# Patient Record
Sex: Female | Born: 1990
Health system: Southern US, Community
[De-identification: ages and names within clinical notes are randomized; demographics above are authoritative.]

## PROBLEM LIST (undated history)

## (undated) ENCOUNTER — Emergency Department: Disposition: A | Discharge status: Home / Self Care | Payer: 59

## (undated) DIAGNOSIS — Z8742 Personal history of other diseases of the female genital tract: Secondary | ICD-10-CM

## (undated) HISTORY — DX: Personal history of other diseases of the female genital tract: Z87.42

---

## 2016-01-11 ENCOUNTER — Other Ambulatory Visit (HOSPITAL_COMMUNITY)
Admission: RE | Admit: 2016-01-11 | Discharge: 2016-01-11 | Disposition: A | Discharge status: Home / Self Care | Payer: Self-pay | Source: Ambulatory Visit | Attending: Family Medicine | Admitting: Family Medicine

## 2016-01-11 ENCOUNTER — Emergency Department (INDEPENDENT_AMBULATORY_CARE_PROVIDER_SITE_OTHER)
Admission: EM | Admit: 2016-01-11 | Discharge: 2016-01-11 | Disposition: A | Discharge status: Home / Self Care | Payer: Self-pay | Source: Home / Self Care | Attending: Family Medicine | Admitting: Family Medicine

## 2016-01-11 ENCOUNTER — Encounter (HOSPITAL_COMMUNITY): Payer: Self-pay | Admitting: Emergency Medicine

## 2016-01-11 DIAGNOSIS — N760 Acute vaginitis: Secondary | ICD-10-CM | POA: Insufficient documentation

## 2016-01-11 DIAGNOSIS — Z113 Encounter for screening for infections with a predominantly sexual mode of transmission: Secondary | ICD-10-CM | POA: Insufficient documentation

## 2016-01-11 LAB — POCT URINALYSIS DIP (DEVICE)
Bilirubin Urine: NEGATIVE
GLUCOSE, UA: NEGATIVE mg/dL
KETONES UR: 15 mg/dL — AB
LEUKOCYTES UA: NEGATIVE
Nitrite: NEGATIVE
Protein, ur: NEGATIVE mg/dL
SPECIFIC GRAVITY, URINE: 1.025 (ref 1.005–1.030)
UROBILINOGEN UA: 1 mg/dL (ref 0.0–1.0)
pH: 7 (ref 5.0–8.0)

## 2016-01-11 MED ORDER — FLUCONAZOLE 150 MG PO TABS: 150.0000 mg | ORAL_TABLET | Freq: Once | ORAL | Status: AC

## 2016-01-11 NOTE — ED Provider Notes (Signed)
CSN: [id number]     Arrival date & time 01/11/16  1444 History   First MD Initiated Contact with Patient 01/11/16 1711     Chief Complaint  Patient presents with  . Vaginal Discharge   (Consider location/radiation/quality/duration/timing/severity/associated sxs/prior Treatment) Patient is a 25 y.o. female presenting with vaginal discharge. The history is provided by the patient. No language interpreter was used.  Vaginal Discharge Quality:  White, thin and milky Onset quality:  Gradual Timing:  Constant Progression:  Worsening Chronicity:  New Context: not after intercourse and not recent antibiotic use   Relieved by:  Nothing Worsened by:  Nothing tried Ineffective treatments:  None tried Associated symptoms: abdominal pain   Associated symptoms: no dyspareunia, no dysuria, no fever, no genital lesions, no nausea, no urinary frequency, no vaginal itching and no vomiting   Associated symptoms comment:  Mild left lower quadrant abdominal pain Risk factors: no gynecological surgery, no new sexual partner and no PID   Risk factors comment:  She had chlamydia 4 yrs ago. She uses condom regularly. She is sexually active   History reviewed. No pertinent past medical history. History reviewed. No pertinent past surgical history. No family history on file. Social History  Substance Use Topics  . Smoking status: Never Smoker   . Smokeless tobacco: None  . Alcohol Use: Yes     Comment: ocassionally   OB History    No data available     Review of Systems  Constitutional: Negative for fever.  Respiratory: Negative.   Cardiovascular: Negative.   Gastrointestinal: Positive for abdominal pain. Negative for nausea, vomiting, diarrhea and constipation.  Genitourinary: Positive for vaginal discharge. Negative for dysuria and dyspareunia.  All other systems reviewed and are negative.   Allergies  Review of patient's allergies indicates no known allergies.  Home Medications   Prior  to Admission medications   Not on File   Meds Ordered and Administered this Visit  Medications - No data to display  BP 106/64 mmHg  Pulse 82  Temp(Src) 98 F (36.7 C) (Oral)  Resp 18  SpO2 98%  LMP 12/09/2015 No data found.   Physical Exam  Constitutional: She appears well-developed. No distress.  Cardiovascular: Normal rate, regular rhythm and normal heart sounds.   No murmur heard. Pulmonary/Chest: Effort normal and breath sounds normal. No respiratory distress. She has no wheezes.  Abdominal: Soft. Bowel sounds are normal. She exhibits no distension and no mass. There is no tenderness.  Genitourinary: Cervix exhibits discharge. Cervix exhibits no friability.    Nursing note and vitals reviewed.   ED Course  Procedures (including critical care time)  Labs Review Labs Reviewed  POCT URINALYSIS DIP (DEVICE) - Abnormal; Notable for the following:    Ketones, ur 15 (*)    Hgb urine dipstick TRACE (*)    All other components within normal limits    Imaging Review No results found.   Visual Acuity Review  Right Eye Distance:   Left Eye Distance:   Bilateral Distance:    Right Eye Near:   Left Eye Near:    Bilateral Near:         MDM  No diagnosis found. Vaginitis  Pelvic exam completed and vaginal specimen obtain for wet prep and GC/Chlamydia. One dose of diflucan given due to the appearance of her discharge. Patient otherwise stable for discharge home. I will contact her with test result and treat if necessary.    Krista ElandKehinde T Anquan Azzarello, MD 01/11/16 1728

## 2016-01-11 NOTE — Discharge Instructions (Signed)
Vaginitis Vaginitis is an inflammation of the vagina. It can happen when the normal bacteria and yeast in the vagina grow too much. There are different types. Treatment will depend on the type you have. HOME CARE  Take all medicines as told by your doctor.  Keep your vagina area clean and dry. Avoid soap. Rinse the area with water.  Avoid washing and cleaning out the vagina (douching).  Do not use tampons or have sex (intercourse) until your treatment is done.  Wipe from front to back after going to the restroom.  Wear cotton underwear.  Avoid wearing underwear while you sleep until your vaginitis is gone.  Avoid tight pants. Avoid underwear or nylons without a cotton panel.  Take off wet clothing (such as a bathing suit) as soon as you can.  Use mild, unscented products. Avoid fabric softeners and scented:  Feminine sprays.  Laundry detergents.  Tampons.  Soaps or bubble baths.  Practice safe sex and use condoms. GET HELP RIGHT AWAY IF:   You have belly (abdominal) pain.  You have a fever or lasting symptoms for more than 2-3 days.  You have a fever and your symptoms suddenly get worse. MAKE SURE YOU:   Understand these instructions.  Will watch this condition.  Will get help right away if you are not doing well or get worse.   This information is not intended to replace advice given to you by your health care provider. Make sure you discuss any questions you have with your health care provider.   Document Released: 03/12/2009 Document Revised: 09/07/2012 Document Reviewed: 05/26/2012 Elsevier Interactive Patient Education 2016 Elsevier Inc.  

## 2016-01-11 NOTE — ED Notes (Signed)
Here with c/o 3 days white increased vaginal d/c, slight odor and left lower quad discomfort Denies fever,chills,n,v LMP- 12/12, Nexplanon

## 2016-01-13 LAB — CERVICOVAGINAL ANCILLARY ONLY
CHLAMYDIA, DNA PROBE: NEGATIVE
NEISSERIA GONORRHEA: NEGATIVE

## 2016-01-14 ENCOUNTER — Telehealth (HOSPITAL_COMMUNITY): Payer: Self-pay | Admitting: Emergency Medicine

## 2016-01-14 LAB — CERVICOVAGINAL ANCILLARY ONLY: Wet Prep (BD Affirm): POSITIVE — AB

## 2016-01-14 MED ORDER — METRONIDAZOLE 500 MG PO TABS: 500.0000 mg | ORAL_TABLET | Freq: Two times a day (BID) | ORAL | Status: DC

## 2016-01-14 NOTE — ED Provider Notes (Signed)
CSN: [id number]     Arrival date & time 01/11/16  1444 History   First MD Initiated Contact with Patient 01/11/16 1711     Chief Complaint  Patient presents with  . Vaginal Discharge   (Consider location/radiation/quality/duration/timing/severity/associated sxs/prior Treatment) HPI  History reviewed. No pertinent past medical history. History reviewed. No pertinent past surgical history. No family history on file. Social History  Substance Use Topics  . Smoking status: Never Smoker   . Smokeless tobacco: None  . Alcohol Use: Yes     Comment: ocassionally   OB History    No data available     Review of Systems  Allergies  Review of patient's allergies indicates no known allergies.  Home Medications   Prior to Admission medications   Medication Sig Start Date End Date Taking? Authorizing Provider  fluconazole (DIFLUCAN) 150 MG tablet Take 1 tablet (150 mg total) by mouth once. 01/11/16 01/18/16  Doreene Eland, MD  metroNIDAZOLE (FLAGYL) 500 MG tablet Take 1 tablet (500 mg total) by mouth 2 (two) times daily. X 7 days 01/14/16   Hayden Rasmussen, NP   Meds Ordered and Administered this Visit  Medications - No data to display  BP 106/64 mmHg  Pulse 82  Temp(Src) 98 F (36.7 C) (Oral)  Resp 18  SpO2 98%  LMP 12/09/2015 No data found.   Physical Exam  ED Course  Procedures (including critical care time)  Labs Review Labs Reviewed  POCT URINALYSIS DIP (DEVICE) - Abnormal; Notable for the following:    Ketones, ur 15 (*)    Hgb urine dipstick TRACE (*)    All other components within normal limits  CERVICOVAGINAL ANCILLARY ONLY - Abnormal; Notable for the following:    Wet Prep (BD Affirm) **POSITIVE for Gardnerella** (*)    All other components within normal limits  CERVICOVAGINAL ANCILLARY ONLY    Imaging Review No results found.   Visual Acuity Review  Right Eye Distance:   Left Eye Distance:   Bilateral Distance:    Right Eye Near:   Left Eye Near:     Bilateral Near:         MDM   1. Vaginitis      01/13/2015 Cervical cytology reveals gardnerella. Tx with flagyl 500 bid x 7 d. E- scribed to listed pharmacy by D. Macenzie Burford, NP     Hayden Rasmussen, NP 01/14/16 1423

## 2016-01-14 NOTE — ED Notes (Signed)
Called pt in re: to recent labs for visit on 01/11/16... Pt ID'd properly Notified her of results... Neg for Gon/Chlam ... Pos for Gardnerella Pt reports she's feeling better w/sx subsiding.  Adv pt that we need to e-Rx Flagy to her pharmacy.... Pt uses [facility])... Updated pharmacy Spoke w/David M, NP... e-Rx Flagyl 500 mg BID x7 days Adv pt to return if sx are not getting any better.  Pt given education on safe sex and proper hygiene... Pt verb understanding

## 2016-07-12 ENCOUNTER — Encounter (HOSPITAL_COMMUNITY): Payer: Self-pay | Admitting: Emergency Medicine

## 2016-07-12 ENCOUNTER — Ambulatory Visit (HOSPITAL_COMMUNITY)
Admission: EM | Admit: 2016-07-12 | Discharge: 2016-07-12 | Disposition: A | Discharge status: Home / Self Care | Payer: Self-pay | Attending: Family Medicine | Admitting: Family Medicine

## 2016-07-12 DIAGNOSIS — N760 Acute vaginitis: Secondary | ICD-10-CM

## 2016-07-12 DIAGNOSIS — B3731 Acute candidiasis of vulva and vagina: Secondary | ICD-10-CM

## 2016-07-12 DIAGNOSIS — Z880 Allergy status to penicillin: Secondary | ICD-10-CM | POA: Insufficient documentation

## 2016-07-12 DIAGNOSIS — B373 Candidiasis of vulva and vagina: Secondary | ICD-10-CM

## 2016-07-12 DIAGNOSIS — Z79899 Other long term (current) drug therapy: Secondary | ICD-10-CM | POA: Insufficient documentation

## 2016-07-12 LAB — POCT URINALYSIS DIP (DEVICE)
GLUCOSE, UA: NEGATIVE mg/dL
Ketones, ur: NEGATIVE mg/dL
LEUKOCYTES UA: NEGATIVE
Nitrite: NEGATIVE
PROTEIN: 30 mg/dL — AB
SPECIFIC GRAVITY, URINE: 1.025 (ref 1.005–1.030)
UROBILINOGEN UA: 1 mg/dL (ref 0.0–1.0)
pH: 6.5 (ref 5.0–8.0)

## 2016-07-12 LAB — POCT PREGNANCY, URINE: PREG TEST UR: NEGATIVE

## 2016-07-12 MED ORDER — FLUCONAZOLE 150 MG PO TABS: 150.0000 mg | ORAL_TABLET | Freq: Once | ORAL | Status: AC

## 2016-07-12 NOTE — Discharge Instructions (Signed)
You have an unusually large amount of thick discharge in your vaginal vault. This is likely yeast infection. Take one dose of diflucan today and repeat the other in 3 days. We will contact you with other test result.   Monilial Vaginitis Vaginitis in a soreness, swelling and redness (inflammation) of the vagina and vulva. Monilial vaginitis is not a sexually transmitted infection. CAUSES  Yeast vaginitis is caused by yeast (candida) that is normally found in your vagina. With a yeast infection, the candida has overgrown in number to a point that upsets the chemical balance. SYMPTOMS   White, thick vaginal discharge.  Swelling, itching, redness and irritation of the vagina and possibly the lips of the vagina (vulva).  Burning or painful urination.  Painful intercourse. DIAGNOSIS  Things that may contribute to monilial vaginitis are:  Postmenopausal and virginal states.  Pregnancy.  Infections.  Being tired, sick or stressed, especially if you had monilial vaginitis in the past.  Diabetes. Good control will help lower the chance.  Birth control pills.  Tight fitting garments.  Using bubble bath, feminine sprays, douches or deodorant tampons.  Taking certain medications that kill germs (antibiotics).  Sporadic recurrence can occur if you become ill. TREATMENT  Your caregiver will give you medication.  There are several kinds of anti monilial vaginal creams and suppositories specific for monilial vaginitis. For recurrent yeast infections, use a suppository or cream in the vagina 2 times a week, or as directed.  Anti-monilial or steroid cream for the itching or irritation of the vulva may also be used. Get your caregiver's permission.  Painting the vagina with methylene blue solution may help if the monilial cream does not work.  Eating yogurt may help prevent monilial vaginitis. HOME CARE INSTRUCTIONS   Finish all medication as prescribed.  Do not have sex until  treatment is completed or after your caregiver tells you it is okay.  Take warm sitz baths.  Do not douche.  Do not use tampons, especially scented ones.  Wear cotton underwear.  Avoid tight pants and panty hose.  Tell your sexual partner that you have a yeast infection. They should go to their caregiver if they have symptoms such as mild rash or itching.  Your sexual partner should be treated as well if your infection is difficult to eliminate.  Practice safer sex. Use condoms.  Some vaginal medications cause latex condoms to fail. Vaginal medications that harm condoms are:  Cleocin cream.  Butoconazole (Femstat).  Terconazole (Terazol) vaginal suppository.  Miconazole (Monistat) (may be purchased over the counter). SEEK MEDICAL CARE IF:   You have a temperature by mouth above 102 F (38.9 C).  The infection is getting worse after 2 days of treatment.  The infection is not getting better after 3 days of treatment.  You develop blisters in or around your vagina.  You develop vaginal bleeding, and it is not your menstrual period.  You have pain when you urinate.  You develop intestinal problems.  You have pain with sexual intercourse.   This information is not intended to replace advice given to you by your health care provider. Make sure you discuss any questions you have with your health care provider.   Document Released: 09/23/2005 Document Revised: 03/07/2012 Document Reviewed: 06/17/2015 Elsevier Interactive Patient Education [employer]2016 Elsevier Inc.

## 2016-07-12 NOTE — ED Notes (Signed)
[facility] with a complaint of a vaginal discharge x 1 week. The patient stated that she did use Monistat1 OTC with no results.

## 2016-07-12 NOTE — ED Provider Notes (Signed)
CSN: [id number]     Arrival date & time 07/12/16  1437 History   First MD Initiated Contact with Patient 07/12/16 1645     Chief Complaint  Patient presents with  . Vaginal Discharge   (Consider location/radiation/quality/duration/timing/severity/associated sxs/prior Treatment) Patient is a 25 y.o. female presenting with vaginal discharge. The history is provided by the patient. No language interpreter was used.  Vaginal Discharge Quality:  White and thick Onset quality:  Gradual Duration:  1 week Timing:  Constant Progression:  Unchanged Context: spontaneously   Context: not after intercourse and not during urination   Relieved by:  Nothing Worsened by:  Nothing tried Ineffective treatments: OTC monistat. Associated symptoms: no abdominal pain, no dysuria, no genital lesions, no nausea, no rash, no vaginal itching and no vomiting   Associated symptoms comment:  Last sexual intercourse was 2 weeks ago. She used protection. LMP was 2 wks ago. She uses Nexplanon on Risk factors: no immunosuppression, no STI exposure and no unprotected sex     History reviewed. No pertinent past medical history. History reviewed. No pertinent past surgical history. History reviewed. No pertinent family history. Social History  Substance Use Topics  . Smoking status: Never Smoker   . Smokeless tobacco: None  . Alcohol Use: Yes     Comment: ocassionally   OB History    No data available     Review of Systems  Respiratory: Negative.   Cardiovascular: Negative.   Gastrointestinal: Negative for nausea, vomiting and abdominal pain.  Genitourinary: Positive for vaginal discharge. Negative for dysuria and frequency.  All other systems reviewed and are negative.   Allergies  Penicillins  Home Medications   Prior to Admission medications   Medication Sig Start Date End Date Taking? Authorizing Provider  metroNIDAZOLE (FLAGYL) 500 MG tablet Take 1 tablet (500 mg total) by mouth 2 (two) times  daily. X 7 days 01/14/16   Hayden Rasmussen, NP   Meds Ordered and Administered this Visit  Medications - No data to display  BP 104/67 mmHg  Pulse 86  Temp(Src) 99.2 F (37.3 C) (Oral)  Resp 16  SpO2 97%  LMP 06/28/2016 (Exact Date) No data found.   Physical Exam  Constitutional: She appears well-developed. No distress.  Cardiovascular: Normal rate, regular rhythm and normal heart sounds.   No murmur heard. Pulmonary/Chest: Effort normal and breath sounds normal. No respiratory distress. She has no wheezes.  Abdominal: She exhibits no distension.  Genitourinary: There is no rash or tenderness on the right labia. There is no rash or tenderness on the left labia. Cervix exhibits discharge. Vaginal discharge found.  Large amount of thick white cheese like discharge  Nursing note and vitals reviewed.   ED Course  Procedures (including critical care time)  Labs Review Labs Reviewed  POCT URINALYSIS DIP (DEVICE) - Abnormal; Notable for the following:    Bilirubin Urine SMALL (*)    Hgb urine dipstick TRACE (*)    Protein, ur 30 (*)    All other components within normal limits  POCT PREGNANCY, URINE    Imaging Review No results found.   Visual Acuity Review  Right Eye Distance:   Left Eye Distance:   Bilateral Distance:    Right Eye Near:   Left Eye Near:    Bilateral Near:      Urinalysis    Component Value Date/Time   LABSPEC 1.025 07/12/2016 1635   PHURINE 6.5 07/12/2016 1635   GLUCOSEU NEGATIVE 07/12/2016 1635   HGBUR TRACE* 07/12/2016  1635   BILIRUBINUR SMALL* 07/12/2016 1635   KETONESUR NEGATIVE 07/12/2016 1635   PROTEINUR 30* 07/12/2016 1635   UROBILINOGEN 1.0 07/12/2016 1635   NITRITE NEGATIVE 07/12/2016 1635   LEUKOCYTESUR NEGATIVE 07/12/2016 1635       MDM  No diagnosis found. Vaginitis  Candida vaginitis   usually large amount of discharge suggestive of candida infection. Diflucan prescribed, to repeat second dose in 3 days given the  quantity of her discharge. GC/Chlamydia tested as well as HIV and RPR. I will contact her with result. Urine pregnancy neg.    Doreene ElandKehinde T Eniola, MD 07/12/16 [phone]

## 2016-07-13 LAB — HIV ANTIBODY (ROUTINE TESTING W REFLEX): HIV Screen 4th Generation wRfx: NONREACTIVE

## 2016-07-13 LAB — CERVICOVAGINAL ANCILLARY ONLY
Chlamydia: NEGATIVE
Neisseria Gonorrhea: NEGATIVE

## 2016-07-13 LAB — RPR: RPR Ser Ql: NONREACTIVE

## 2016-07-14 LAB — CERVICOVAGINAL ANCILLARY ONLY: Wet Prep (BD Affirm): POSITIVE — AB

## 2016-07-15 ENCOUNTER — Telehealth: Payer: Self-pay | Admitting: Family Medicine

## 2016-07-15 ENCOUNTER — Telehealth (HOSPITAL_COMMUNITY): Payer: Self-pay | Admitting: *Deleted

## 2016-07-15 MED ORDER — METRONIDAZOLE 500 MG PO TABS: 500.0000 mg | ORAL_TABLET | Freq: Two times a day (BID) | ORAL | Status: AC

## 2016-07-15 NOTE — Telephone Encounter (Signed)
Positive BV discussed with patient. Metronidazole e-scribed. All questions were answered.

## 2016-11-24 ENCOUNTER — Encounter (HOSPITAL_COMMUNITY): Payer: Self-pay | Admitting: Emergency Medicine

## 2016-11-24 ENCOUNTER — Ambulatory Visit (HOSPITAL_COMMUNITY)
Admission: EM | Admit: 2016-11-24 | Discharge: 2016-11-24 | Disposition: A | Discharge status: Home / Self Care | Payer: 59 | Attending: Emergency Medicine | Admitting: Emergency Medicine

## 2016-11-24 DIAGNOSIS — J039 Acute tonsillitis, unspecified: Secondary | ICD-10-CM

## 2016-11-24 DIAGNOSIS — J029 Acute pharyngitis, unspecified: Secondary | ICD-10-CM | POA: Diagnosis present

## 2016-11-24 DIAGNOSIS — L040 Acute lymphadenitis of face, head and neck: Secondary | ICD-10-CM | POA: Diagnosis not present

## 2016-11-24 DIAGNOSIS — Z880 Allergy status to penicillin: Secondary | ICD-10-CM | POA: Insufficient documentation

## 2016-11-24 LAB — POCT RAPID STREP A: Streptococcus, Group A Screen (Direct): NEGATIVE

## 2016-11-24 MED ORDER — ACETAMINOPHEN 325 MG PO TABS
ORAL_TABLET | ORAL | Status: AC
  Filled 2016-11-24: qty 2

## 2016-11-24 MED ORDER — ACETAMINOPHEN 325 MG PO TABS
650.0000 mg | ORAL_TABLET | Freq: Once | ORAL | Status: AC
  Administered 2016-11-24: 650 mg via ORAL

## 2016-11-24 MED ORDER — AZITHROMYCIN 250 MG PO TABS: ORAL_TABLET | ORAL | 0 refills | Status: DC

## 2016-11-24 NOTE — ED Triage Notes (Signed)
Sore throat, very painful with swallowing.  Feels very tired and low energy.  Denies fever.  Onset of symptoms Saturday night.  Patient has had headaches

## 2016-11-24 NOTE — Discharge Instructions (Signed)
Drink lots of cool liquids. May use Cepacol lozenges to help numb the throat. Ibuprofen 600 mg every 6 hours as needed for throat pain and discomfort. May also take Tylenol every 4 hours.

## 2016-11-24 NOTE — ED Provider Notes (Signed)
CSN: [id number]     Arrival date & time 11/24/16  1837 History   First MD Initiated Contact with Patient 11/24/16 2023     Chief Complaint  Patient presents with  . Sore Throat   (Consider location/radiation/quality/duration/timing/severity/associated sxs/prior Treatment) 25 year old female complaining of sore throat 3 days. States she has had undocumented fever at home and sweating. Denies earache or GI symptoms.      History reviewed. No pertinent past medical history. History reviewed. No pertinent surgical history. History reviewed. No pertinent family history. Social History  Substance Use Topics  . Smoking status: Never Smoker  . Smokeless tobacco: Not on file  . Alcohol use Yes     Comment: ocassionally   OB History    No data available     Review of Systems  Constitutional: Positive for activity change, chills, fatigue and fever.  HENT: Positive for rhinorrhea and sore throat.   Respiratory: Negative.   Gastrointestinal: Negative.   Genitourinary: Negative.   Neurological: Negative.   All other systems reviewed and are negative.   Allergies  Penicillins  Home Medications   Prior to Admission medications   Medication Sig Start Date End Date Taking? Authorizing Provider  azithromycin (ZITHROMAX) 250 MG tablet 2 tabs po daily for 5 days 11/24/16   Hayden Rasmussenavid Aune Adami, NP   Meds Ordered and Administered this Visit   Medications  acetaminophen (TYLENOL) tablet 650 mg (650 mg Oral Given 11/24/16 2003)    BP 111/64 (BP Location: Left Arm)   Pulse 103   Temp 101.3 F (38.5 C) (Oral)   Resp 18   LMP 11/24/2016   SpO2 97%  No data found.   Physical Exam  Constitutional: She is oriented to person, place, and time. She appears well-developed and well-nourished. No distress.  HENT:  Head: Normocephalic and atraumatic.  Mouth/Throat: Oropharyngeal exudate present.  Left TM obscured by large amount of earwax. Right TM partially observed and transparent without  apparent erythema. Oropharynx with erythema, mildly enlarged palatine tonsils with exudates.  Eyes: EOM are normal.  Neck: Normal range of motion. Neck supple.  Cardiovascular: Normal rate and regular rhythm.   Pulmonary/Chest: Effort normal.  Lymphadenopathy:    She has cervical adenopathy.  Neurological: She is alert and oriented to person, place, and time.  Skin: Skin is warm and dry. No rash noted.  Nursing note and vitals reviewed.   Urgent Care Course   Clinical Course     Procedures (including critical care time)  Labs Review Labs Reviewed  POCT RAPID STREP A   Results for orders placed or performed during the hospital encounter of 11/24/16  POCT rapid strep A [facility]  Result Value Ref Range   Streptococcus, Group A Screen (Direct) NEGATIVE NEGATIVE     Imaging Review No results found.   Visual Acuity Review  Right Eye Distance:   Left Eye Distance:   Bilateral Distance:    Right Eye Near:   Left Eye Near:    Bilateral Near:         MDM   1. Exudative tonsillitis   2. Acute cervical adenitis    Drink lots of cool liquids. May use Cepacol lozenges to help numb the throat. Ibuprofen 600 mg every 6 hours as needed for throat pain and discomfort. May also take Tylenol every 4 hours. Meds ordered this encounter  Medications  . acetaminophen (TYLENOL) tablet 650 mg  . azithromycin (ZITHROMAX) 250 MG tablet    Sig: 2 tabs po daily  for 5 days    Dispense:  10 tablet    Refill:  0    Order Specific Question:   Supervising Provider    Answer:   Micheline ChapmanHONIG, ERIN J [[id number]]       Hayden Rasmussenavid Christabel Camire, NP 11/24/16 2033

## 2016-11-27 LAB — CULTURE, GROUP A STREP (THRC)

## 2016-12-01 ENCOUNTER — Telehealth (HOSPITAL_COMMUNITY): Payer: Self-pay | Admitting: Emergency Medicine

## 2016-12-01 NOTE — Telephone Encounter (Signed)
Called pt and notified of recent lab results  Pt ID'd properly... Reports feeling better and sx have subsided Pt is taking/tolarating well meds given at visit.  Adv pt if sx are not getting better to return or to f/u w/PCP Pt verb understanding.

## 2016-12-01 NOTE — Telephone Encounter (Signed)
-----   Message from Charm RingsErin J Honig, MD sent at 11/27/2016  5:51 PM EST ----- Please notify patient of negative throat culture. Okay [facility]. EH

## 2017-04-04 ENCOUNTER — Ambulatory Visit (HOSPITAL_COMMUNITY)
Admission: EM | Admit: 2017-04-04 | Discharge: 2017-04-04 | Disposition: A | Discharge status: Home / Self Care | Payer: 59 | Attending: Emergency Medicine | Admitting: Emergency Medicine

## 2017-04-04 ENCOUNTER — Encounter (HOSPITAL_COMMUNITY): Payer: Self-pay | Admitting: Emergency Medicine

## 2017-04-04 DIAGNOSIS — B9689 Other specified bacterial agents as the cause of diseases classified elsewhere: Secondary | ICD-10-CM | POA: Insufficient documentation

## 2017-04-04 DIAGNOSIS — Z880 Allergy status to penicillin: Secondary | ICD-10-CM | POA: Insufficient documentation

## 2017-04-04 DIAGNOSIS — N760 Acute vaginitis: Secondary | ICD-10-CM

## 2017-04-04 DIAGNOSIS — N898 Other specified noninflammatory disorders of vagina: Secondary | ICD-10-CM | POA: Diagnosis present

## 2017-04-04 MED ORDER — METRONIDAZOLE 500 MG PO TABS: 500.0000 mg | ORAL_TABLET | Freq: Two times a day (BID) | ORAL | 0 refills | Status: AC

## 2017-04-04 NOTE — ED Provider Notes (Signed)
HPI  SUBJECTIVE:  Krista Pearson is a 26 y.o. female who presents with increased vaginal discharge for the past 10 days. No dysuria, urgency, frequency, cloudy or oderous urine, hematuria,  genital blisters, vaginal itching. No aggravating, alleviating factors. Has not tried anything for this. No fevers, N/V, abd pain, leg pain, back pain. No recent abx use, perfumed soaps / bodywashes. No dysparunia. Pt is in a longterm monogamous sexual relationship with a female partner who is  asxatic. Does use  condoms consistently. STD's not a concern today. Similar sx before when had BV. No history of gonorrhea chlamydia, Trichomonas, yeast infection. No h/o syphilis, herpes, HIV. No h/o PID, ectopic pregnancy. No h/o DM, HTN. LMP: 3 weeks ago. Denies possibility of being pregnant states he do not need to check. PMD: None.    History reviewed. No pertinent past medical history.  History reviewed. No pertinent surgical history.  History reviewed. No pertinent family history.  Social History  Substance Use Topics  . Smoking status: Never Smoker  . Smokeless tobacco: Never Used  . Alcohol use Yes     Comment: ocassionally    No current facility-administered medications for this encounter.   Current Outpatient Prescriptions:  .  metroNIDAZOLE (FLAGYL) 500 MG tablet, Take 1 tablet (500 mg total) by mouth 2 (two) times daily., Disp: 14 tablet, Rfl: 0  Allergies  Allergen Reactions  . Penicillins      ROS  As noted in HPI.   Physical Exam  BP 94/61 (BP Location: Right Arm)   Pulse 96   Temp 97.8 F (36.6 C) (Oral)   Resp 16   SpO2 100%   Constitutional: Well developed, well nourished, no acute distress Eyes:  EOMI, conjunctiva normal bilaterally HENT: Normocephalic, atraumatic,mucus membranes moist Respiratory: Normal inspiratory effort Cardiovascular: Normal rate GI:  No suprapubic tenderness  GU: External genitalia normal.  Normal vaginal mucosa.  Normal os. Thin  nonoderous  white  vaginal discharge.  Uterus smooth, NT. No  CMT. No adnexal tenderness. No adnexal masses.  Chaperone present during exam skin: No rash, skin intact Musculoskeletal: no deformities Neurologic: Alert & oriented x 3, no focal neuro deficits Psychiatric: Speech and behavior appropriate   ED Course   Medications - No data to display  Orders Placed This Encounter  Procedures  . Pelvic exam    Standing Status:   Standing    Number of Occurrences:   1    No results found for this or any previous visit (from the past 24 hour(s)). No results found.  ED Clinical Impression  BV (bacterial vaginosis)   ED Assessment/Plan  Previous records reviewed. She has had BV twice in 2017. no history of gonorrhea Chlamydia HIV, syphilis, yeast  H&P most c/w BV. Sent off GC/chlamydia, wet prep. Pt declined HIV, RPR. Will not treat empirically now.  Will send home with flagyl. Advised pt to refrain from sexual contact until she he knows lab results, symptoms resolve, and partner(s) are treated if necessary. Pt provided working phone number. Follow-up with PMD of choice as needed. Providing primary care referral list. Discussed labs, MDM, plan and followup with patient. Pt agrees with plan.   Meds ordered this encounter  Medications  . metroNIDAZOLE (FLAGYL) 500 MG tablet    Sig: Take 1 tablet (500 mg total) by mouth 2 (two) times daily.    Dispense:  14 tablet    Refill:  0    *This clinic note was created using Scientist, clinical (histocompatibility and immunogenetics). Therefore, there may  be occasional mistakes despite careful proofreading.  ?    Domenick Gong, MD 04/04/17 [id number]

## 2017-04-04 NOTE — ED Triage Notes (Signed)
Patient presents today to [facility] with a complaint of vaginal discharge she states that she has noticed a white colored discharge. Patient denies, itching and odor.

## 2017-04-04 NOTE — Discharge Instructions (Signed)
Take the medication as written. Give us a working phone number so that we can contact you if needed. Refrain from sexual contact until you know your results and your partner(s) are treated if necessary. Return if you get worse, have a fever >100.4, or for any concerns.  ° °Go to www.goodrx.com to look up your medications. This will give you a list of where you can find your prescriptions at the most affordable prices.   ° °Below is a list of primary care practices who are taking new patients for you to follow-up with. °[facility] °[address] °[place], [place] [address] °[phone] ° °[facility] °[phone] °[address] °[place] [address] ° °[facility]: [address] °[place] [address]  °[phone] ° °[facility]: [address] °[place] [address]   °[phone] ° °[facility]: [address] °[place] [address]  °[phone] ° °[facility] : [address] [place] [address] °[phone] ° °[facility]: [address] °[place] [address] °[phone] ° °[facility]: [address] °[place] [address] °[phone] ° °Dr. Mahima Pandey [address] [facility] [place]  [address]  °[phone] ° °Dr. George Osei-Bonsu, [facility]. [address] [place], [place] [address]  °[phone] ° °  °

## 2017-04-05 ENCOUNTER — Telehealth (HOSPITAL_COMMUNITY): Payer: Self-pay | Admitting: Internal Medicine

## 2017-04-05 LAB — CERVICOVAGINAL ANCILLARY ONLY
Bacterial vaginitis: NEGATIVE
CANDIDA VAGINITIS: POSITIVE — AB
CHLAMYDIA, DNA PROBE: NEGATIVE
NEISSERIA GONORRHEA: NEGATIVE
TRICH (WINDOWPATH): NEGATIVE

## 2017-04-05 MED ORDER — FLUCONAZOLE 150 MG PO TABS: 150.0000 mg | ORAL_TABLET | Freq: Once | ORAL | 0 refills | Status: AC

## 2017-04-05 NOTE — Telephone Encounter (Signed)
Clinical staff, please let patient know that test for candida (yeast) was positive.  Will send rx for fluconazole to pharmacy of record, [facility] [address].  Recheck for further evaluation if symptoms are not improving.  LM

## 2017-07-08 ENCOUNTER — Encounter: Payer: Self-pay | Admitting: Family Medicine

## 2017-07-08 ENCOUNTER — Ambulatory Visit (INDEPENDENT_AMBULATORY_CARE_PROVIDER_SITE_OTHER): Payer: 59 | Admitting: Family Medicine

## 2017-07-08 VITALS — BP 120/80 | HR 64 | Ht 66.75 in | Wt 123.0 lb

## 2017-07-08 DIAGNOSIS — Z7689 Persons encountering health services in other specified circumstances: Secondary | ICD-10-CM

## 2017-07-08 DIAGNOSIS — Z975 Presence of (intrauterine) contraceptive device: Secondary | ICD-10-CM | POA: Diagnosis not present

## 2017-07-08 DIAGNOSIS — Z8742 Personal history of other diseases of the female genital tract: Secondary | ICD-10-CM | POA: Diagnosis not present

## 2017-07-08 LAB — COMPREHENSIVE METABOLIC PANEL
ALK PHOS: 37 U/L (ref 33–115)
ALT: 8 U/L (ref 6–29)
AST: 15 U/L (ref 10–30)
Albumin: 4.5 g/dL (ref 3.6–5.1)
BUN: 9 mg/dL (ref 7–25)
CALCIUM: 9.6 mg/dL (ref 8.6–10.2)
CHLORIDE: 102 mmol/L (ref 98–110)
CO2: 25 mmol/L (ref 20–31)
Creat: 0.82 mg/dL (ref 0.50–1.10)
GLUCOSE: 78 mg/dL (ref 65–99)
POTASSIUM: 3.8 mmol/L (ref 3.5–5.3)
Sodium: 137 mmol/L (ref 135–146)
Total Bilirubin: 0.6 mg/dL (ref 0.2–1.2)
Total Protein: 7.5 g/dL (ref 6.1–8.1)

## 2017-07-08 LAB — CBC WITH DIFFERENTIAL/PLATELET
BASOS ABS: 44 {cells}/uL (ref 0–200)
Basophils Relative: 1 %
EOS ABS: 176 {cells}/uL (ref 15–500)
Eosinophils Relative: 4 %
HEMATOCRIT: 42.1 % (ref 35.0–45.0)
Hemoglobin: 13.9 g/dL (ref 11.7–15.5)
LYMPHS PCT: 39 %
Lymphs Abs: 1716 cells/uL (ref 850–3900)
MCH: 30.1 pg (ref 27.0–33.0)
MCHC: 33 g/dL (ref 32.0–36.0)
MCV: 91.1 fL (ref 80.0–100.0)
MONO ABS: 484 {cells}/uL (ref 200–950)
MONOS PCT: 11 %
MPV: 10.1 fL (ref 7.5–12.5)
NEUTROS PCT: 45 %
Neutro Abs: 1980 cells/uL (ref 1500–7800)
PLATELETS: 305 10*3/uL (ref 140–400)
RBC: 4.62 MIL/uL (ref 3.80–5.10)
RDW: 13.1 % (ref 11.0–15.0)
WBC: 4.4 10*3/uL (ref 4.0–10.5)

## 2017-07-08 LAB — POCT URINALYSIS DIP (PROADVANTAGE DEVICE)
BILIRUBIN UA: NEGATIVE
Glucose, UA: NEGATIVE mg/dL
LEUKOCYTES UA: NEGATIVE
NITRITE UA: NEGATIVE
PH UA: 6 (ref 5.0–8.0)
Specific Gravity, Urine: 1.03
Urobilinogen, Ur: NEGATIVE

## 2017-07-08 NOTE — Progress Notes (Signed)
   Subjective:    Patient ID: Krista Pearson, female    DOB: [birth year], 26 y.o.   MRN: [id number]  HPI Chief Complaint  Patient presents with  . new pt    new pt get established. nexplanon needs out been 5-6 years, white discharge- yeast infection-back in april.  cycle is irregular since april    She is new to the practice and here to establish care.  Previous medical care: urgent cares.   Providers: none   Complains of irregular menses for the past 3-4 months. States in her distant past her cycles were regular.  LMP: end of June and had 2 weeks of bleeding but not heavy per patient. No bleeding today.  States she was treated for a yeast infection and BV in April. Reports history of recurrent BV.  States she is no longer having heavy vaginal discharge, itching, odor or vaginal irritation. No urinary symptoms.  She would like to find out why she keeps having vaginal discharge.  States she is using [employer] soap, avoids tub baths, hot tubs, swimming pools. She does wear tight clothing but cotton underwear. States she has been educated on prevention.   Denies fever, chills, dizziness, sore throat, fatigue, chest pain, shortness of breath, abdominal pain, N/V/D, dyspareunia.   States she has a nexplanon that was inserted in 2013. States it is overdue to be removed.   Her first period was at age 26 or [age] but cannot recall for sure.   Pregnancies- none   States diet is relatively well balanced.  Exercise: none   She graduated from [employer]. 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  She is single. No kids.  Using condoms.    Review of Systems Pertinent positives and negatives in the history of present illness.     Objective:   Physical Exam BP 120/80   Pulse 64   Ht 5' 6.75" (1.695 m)   Wt 123 lb (55.8 kg)   LMP 06/26/2017   BMI 19.41 kg/m   Alert and oriented and in no acute distress. Not otherwise examined.      Assessment & Plan:  History of recurrent vaginal  discharge - Plan: CBC with Differential/Platelet, Comprehensive metabolic panel, POCT Urinalysis DIP (Proadvantage Device), GC/Chlamydia Probe Amp  Encounter to establish care  Nexplanon in place  Urinalysis dipstick: spec grav 1.030, trace blood, pro, ket  She is a healthy appearing female asymptomatic today. Discussed options of pelvic exam and swab but since she is not having any vaginal discharge so we will wait and refer her to OB/GYN. She has a nexplanon that is overdue for removal.  Discussed that this is no longer effective birth control and she will continue using condoms.  Will check labs and follow up. Reiterated preventive measures for BV.  She was given a list of names and will contact her insurance company to see who is in her network. Will let me know if she needs a referral.

## 2017-07-08 NOTE — Patient Instructions (Addendum)
Call and schedule with an OB/GYN who is in your network. If you need a referral then call and let me know.  We will call you with your lab results.    Obgyn Offices:   [facility] [address] [place] [id number] [phone]  [facility] Address: [address]                   [place], [place] [address] Phone: [phone]  [facility] [address] [place], [place] [address] Phone: [phone]   [facility] [address] [place], [place] [address] Phone: [phone]

## 2017-07-09 LAB — GC/CHLAMYDIA PROBE AMP
CT PROBE, AMP APTIMA: NOT DETECTED
GC Probe RNA: NOT DETECTED

## 2018-07-17 ENCOUNTER — Other Ambulatory Visit: Payer: Self-pay

## 2018-07-17 ENCOUNTER — Emergency Department (HOSPITAL_COMMUNITY): Payer: 59

## 2018-07-17 ENCOUNTER — Encounter (HOSPITAL_COMMUNITY): Payer: Self-pay | Admitting: Emergency Medicine

## 2018-07-17 ENCOUNTER — Emergency Department (HOSPITAL_COMMUNITY)
Admission: EM | Admit: 2018-07-17 | Discharge: 2018-07-17 | Disposition: A | Discharge status: Home / Self Care | Payer: 59 | Attending: Emergency Medicine | Admitting: Emergency Medicine

## 2018-07-17 DIAGNOSIS — Y999 Unspecified external cause status: Secondary | ICD-10-CM | POA: Diagnosis not present

## 2018-07-17 DIAGNOSIS — Y92410 Unspecified street and highway as the place of occurrence of the external cause: Secondary | ICD-10-CM | POA: Insufficient documentation

## 2018-07-17 DIAGNOSIS — Y939 Activity, unspecified: Secondary | ICD-10-CM | POA: Diagnosis not present

## 2018-07-17 DIAGNOSIS — M25561 Pain in right knee: Secondary | ICD-10-CM | POA: Diagnosis not present

## 2018-07-17 DIAGNOSIS — S8991XA Unspecified injury of right lower leg, initial encounter: Secondary | ICD-10-CM | POA: Diagnosis not present

## 2018-07-17 NOTE — ED Notes (Signed)
Pt verbalized understanding discharge instructions and denies any further needs or questions at this time. VS stable, ambulatory and steady gait.   
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
## 2018-07-17 NOTE — Discharge Instructions (Signed)
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
## 2018-11-30 DIAGNOSIS — Z118 Encounter for screening for other infectious and parasitic diseases: Secondary | ICD-10-CM | POA: Diagnosis not present

## 2018-11-30 DIAGNOSIS — Z681 Body mass index (BMI) 19 or less, adult: Secondary | ICD-10-CM | POA: Diagnosis not present

## 2018-11-30 DIAGNOSIS — Z01419 Encounter for gynecological examination (general) (routine) without abnormal findings: Secondary | ICD-10-CM | POA: Diagnosis not present

## 2019-05-18 ENCOUNTER — Telehealth: Payer: Self-pay | Admitting: Family Medicine

## 2019-05-18 NOTE — Telephone Encounter (Signed)
Called pt to set up appt, she is healthy and no issues at this time, she will call back to schedule a CPE in the near future
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
